# Patient Record
Sex: Female | Born: 1980 | Race: Black or African American | Hispanic: No | Marital: Married
Health system: Southern US, Community
[De-identification: ages and names within clinical notes are randomized; demographics above are authoritative.]

## PROBLEM LIST (undated history)

## (undated) DIAGNOSIS — E785 Hyperlipidemia, unspecified: Secondary | ICD-10-CM

## (undated) DIAGNOSIS — I10 Essential (primary) hypertension: Secondary | ICD-10-CM

## (undated) DIAGNOSIS — E119 Type 2 diabetes mellitus without complications: Secondary | ICD-10-CM

## (undated) HISTORY — DX: Hyperlipidemia, unspecified: E78.5

## (undated) HISTORY — DX: Type 2 diabetes mellitus without complications: E11.9

## (undated) HISTORY — DX: Essential (primary) hypertension: I10

---

## 1999-04-10 HISTORY — PX: BREAST EXCISIONAL BIOPSY: SUR124

## 2009-01-17 ENCOUNTER — Ambulatory Visit: Payer: Self-pay | Admitting: Obstetrics and Gynecology

## 2009-02-07 ENCOUNTER — Ambulatory Visit: Payer: Self-pay | Admitting: Obstetrics and Gynecology

## 2009-03-09 ENCOUNTER — Ambulatory Visit: Payer: Self-pay | Admitting: Obstetrics and Gynecology

## 2009-04-30 ENCOUNTER — Emergency Department: Payer: Self-pay | Admitting: Emergency Medicine

## 2009-06-02 ENCOUNTER — Inpatient Hospital Stay: Payer: Self-pay

## 2010-03-27 ENCOUNTER — Emergency Department: Payer: Self-pay | Admitting: Emergency Medicine

## 2010-10-19 ENCOUNTER — Encounter: Payer: Self-pay | Admitting: Obstetrics & Gynecology

## 2010-10-23 ENCOUNTER — Ambulatory Visit: Payer: Self-pay | Admitting: Obstetrics and Gynecology

## 2010-11-08 ENCOUNTER — Ambulatory Visit: Payer: Self-pay | Admitting: Obstetrics and Gynecology

## 2010-11-13 ENCOUNTER — Emergency Department: Payer: Self-pay | Admitting: Emergency Medicine

## 2010-12-07 ENCOUNTER — Encounter: Payer: Self-pay | Admitting: Obstetrics & Gynecology

## 2010-12-12 ENCOUNTER — Ambulatory Visit: Payer: Self-pay | Admitting: Obstetrics and Gynecology

## 2010-12-25 ENCOUNTER — Encounter: Payer: Self-pay | Admitting: Maternal and Fetal Medicine

## 2010-12-26 ENCOUNTER — Encounter: Payer: Self-pay | Admitting: Pediatric Cardiology

## 2011-02-28 ENCOUNTER — Observation Stay: Payer: Self-pay | Admitting: Obstetrics and Gynecology

## 2011-04-06 ENCOUNTER — Inpatient Hospital Stay: Payer: Self-pay

## 2012-03-06 IMAGING — US US OB >= 14 WKS - NRPT
1 series · 14 of 28 positions shown · non-contrast
Comparison: none

[Series 1: us ob >= 14 wks - nrpt · 14 of 38 slices shown]
[im 2/38]
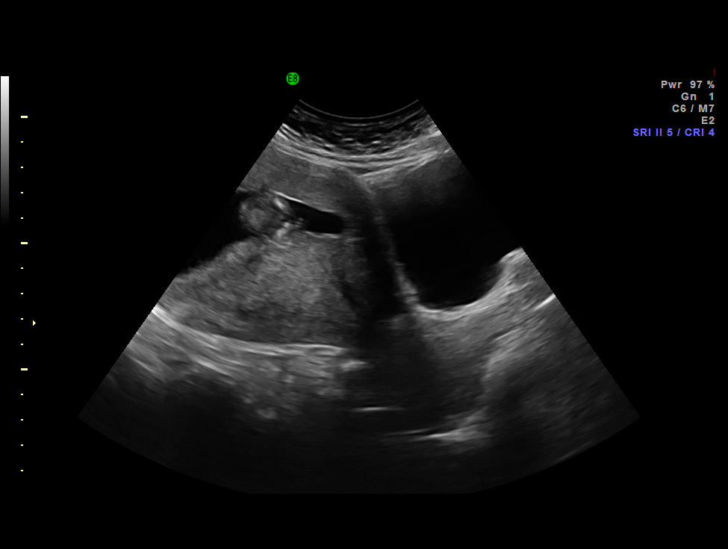
[im 5/38]
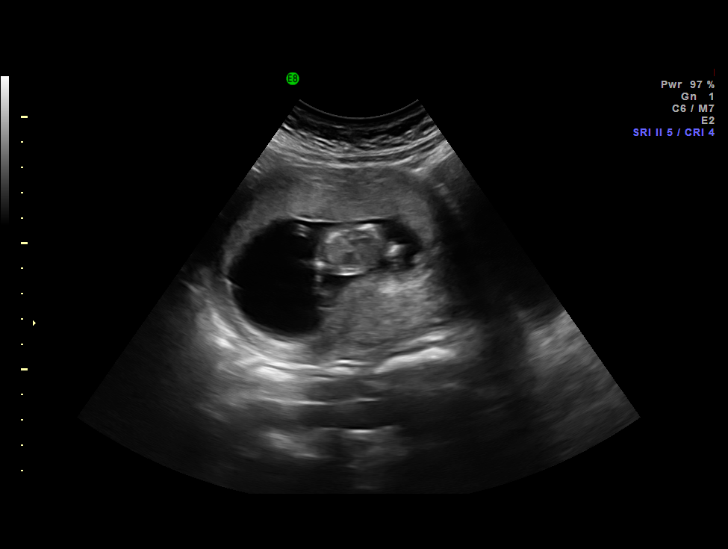
[im 7/38]
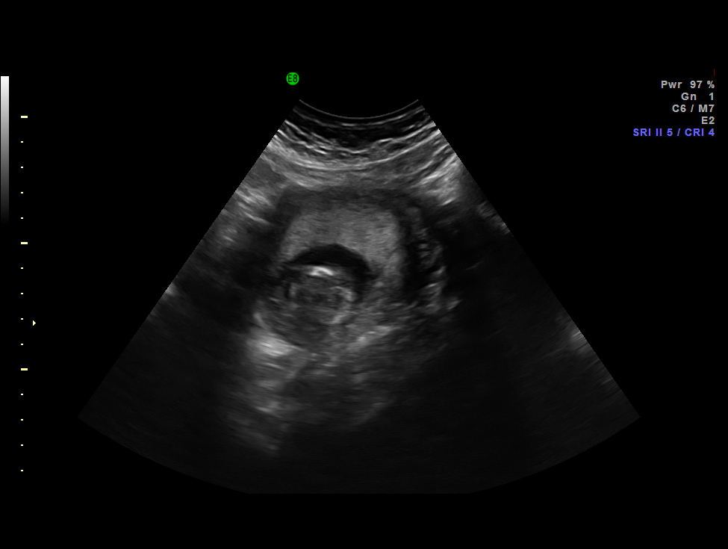
[im 10/38]
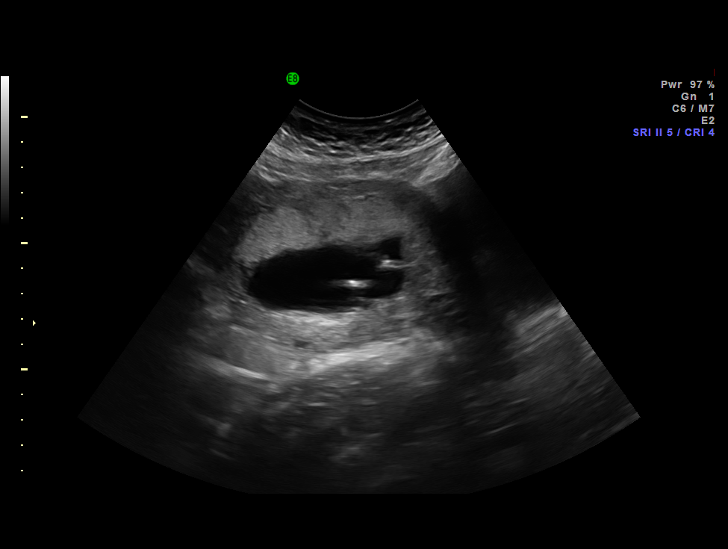
[im 13/38]
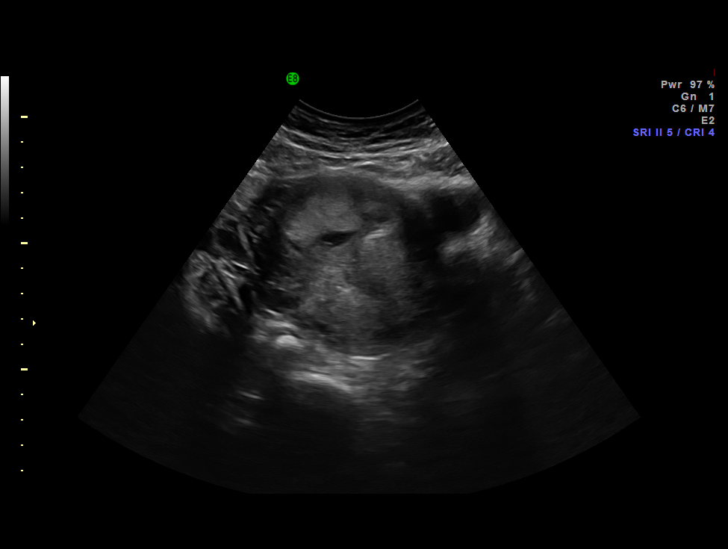
[im 16/38]
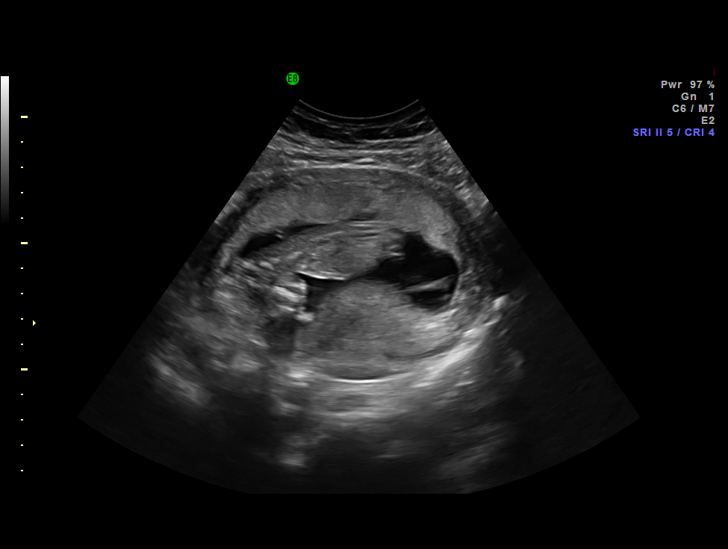
[im 18/38]
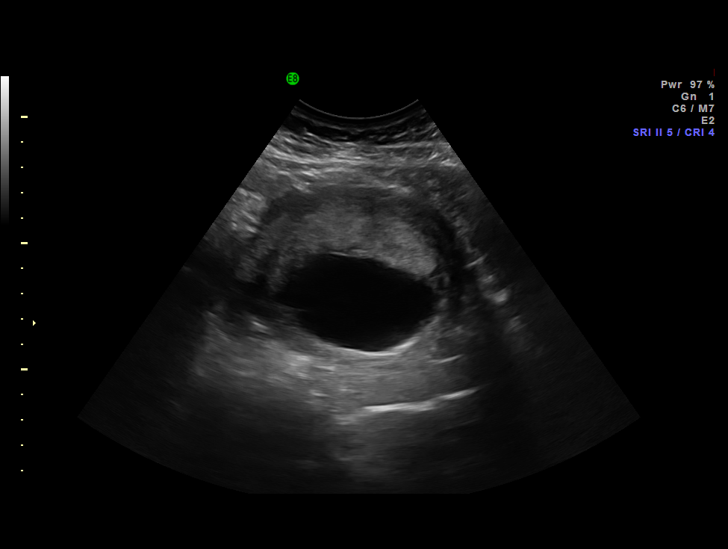
[im 21/38]
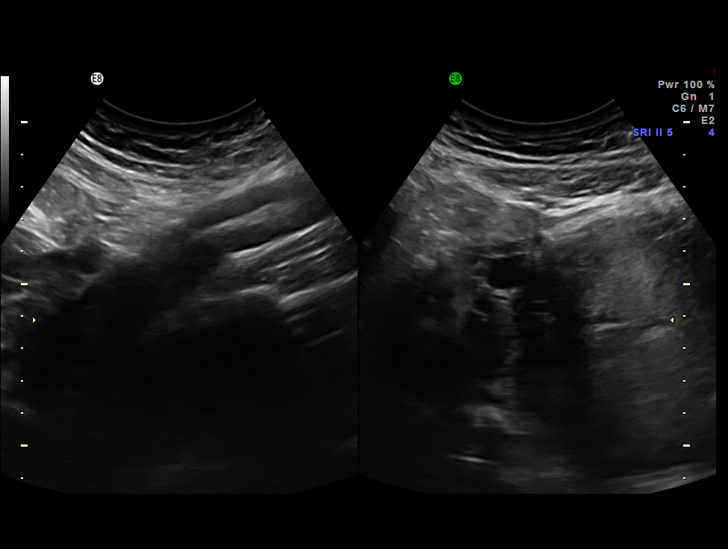
[im 24/38]
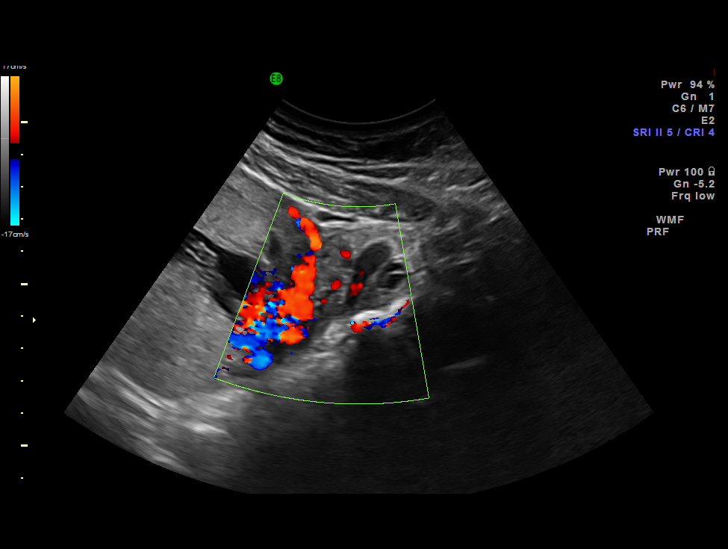
[im 27/38]
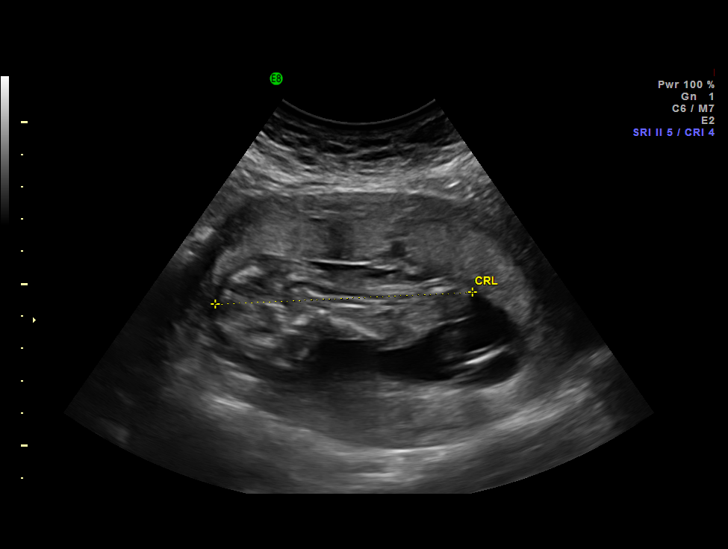
[im 29/38]
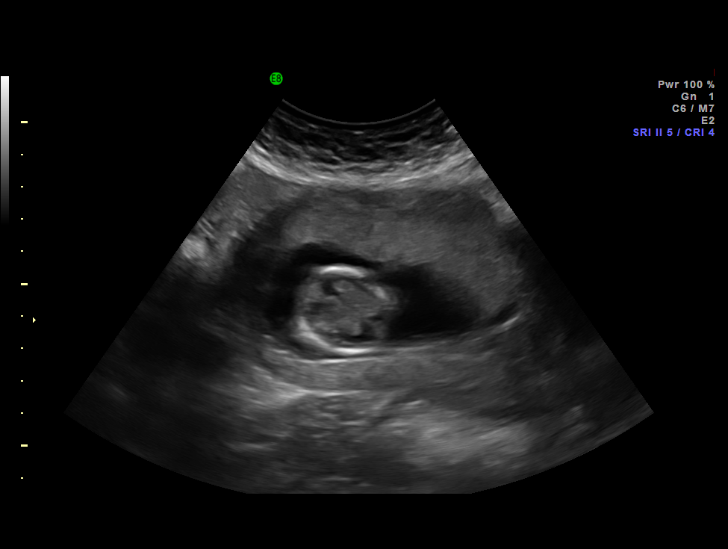
[im 32/38]
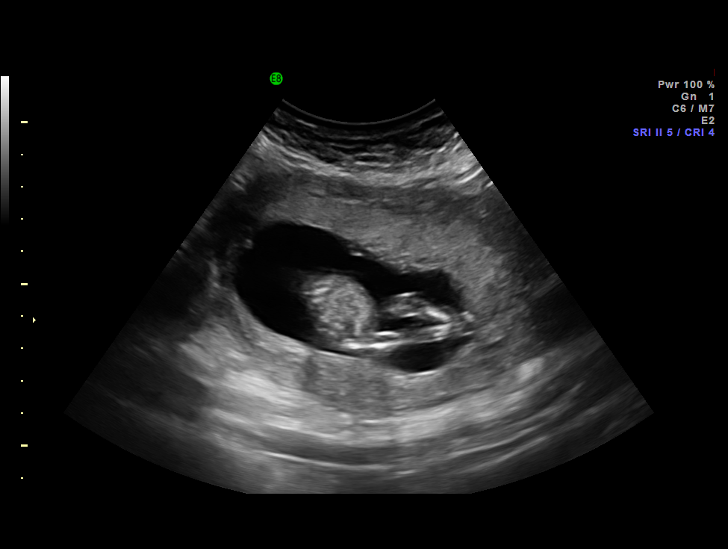
[im 35/38]
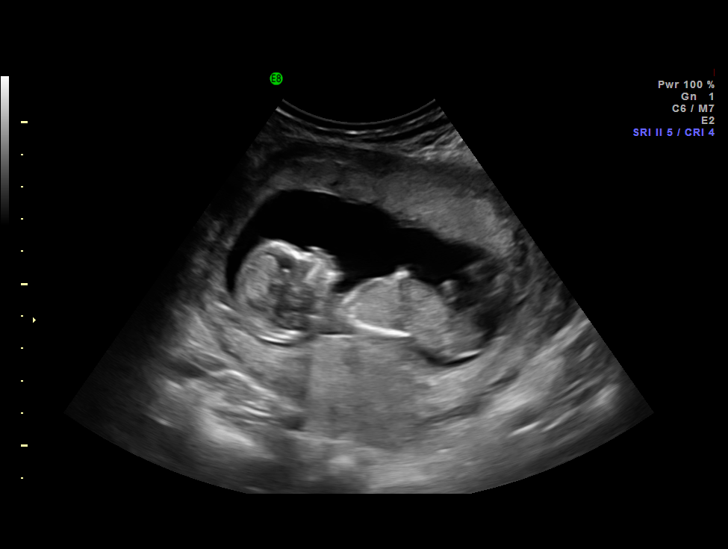
[im 38/38]
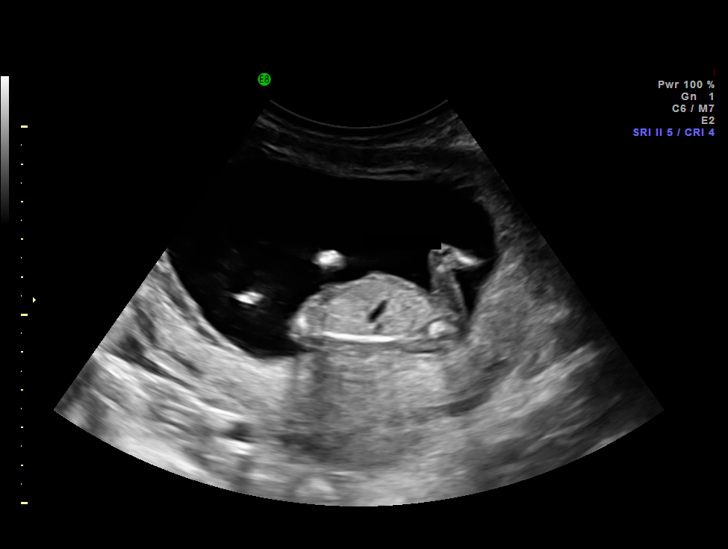

[14 of 28 positions shown; findings below may reference images not displayed]

IMAGES IMPORTED FROM THE SYNGO WORKFLOW SYSTEM
NO DICTATION FOR STUDY

## 2013-03-18 ENCOUNTER — Observation Stay: Payer: Self-pay | Admitting: Obstetrics and Gynecology

## 2013-03-18 LAB — URINALYSIS, COMPLETE
Bilirubin,UR: NEGATIVE
Blood: NEGATIVE
Ketone: NEGATIVE
Ph: 6 (ref 4.5–8.0)
Protein: NEGATIVE
RBC,UR: 1 /HPF (ref 0–5)
Specific Gravity: 1.009 (ref 1.003–1.030)
Squamous Epithelial: 16
WBC UR: 15 /HPF (ref 0–5)

## 2013-04-27 ENCOUNTER — Ambulatory Visit: Payer: Self-pay

## 2013-05-10 ENCOUNTER — Ambulatory Visit: Payer: Self-pay

## 2013-07-25 ENCOUNTER — Inpatient Hospital Stay: Payer: Self-pay | Admitting: Obstetrics and Gynecology

## 2013-07-25 LAB — CBC WITH DIFFERENTIAL/PLATELET
BASOS ABS: 0.1 10*3/uL (ref 0.0–0.1)
Basophil %: 0.6 %
EOS ABS: 0 10*3/uL (ref 0.0–0.7)
Eosinophil %: 0.2 %
HCT: 38.4 % (ref 35.0–47.0)
HGB: 12.3 g/dL (ref 12.0–16.0)
LYMPHS ABS: 1.8 10*3/uL (ref 1.0–3.6)
Lymphocyte %: 18.7 %
MCH: 27.5 pg (ref 26.0–34.0)
MCHC: 32 g/dL (ref 32.0–36.0)
MCV: 86 fL (ref 80–100)
Monocyte #: 0.7 x10 3/mm (ref 0.2–0.9)
Monocyte %: 6.9 %
NEUTROS ABS: 7.1 10*3/uL — AB (ref 1.4–6.5)
Neutrophil %: 73.6 %
PLATELETS: 219 10*3/uL (ref 150–440)
RBC: 4.47 10*6/uL (ref 3.80–5.20)
RDW: 15.5 % — ABNORMAL HIGH (ref 11.5–14.5)
WBC: 9.7 10*3/uL (ref 3.6–11.0)

## 2013-07-25 LAB — GC/CHLAMYDIA PROBE AMP

## 2013-07-25 LAB — GLUCOSE, RANDOM: GLUCOSE: 118 mg/dL — AB (ref 65–99)

## 2013-07-26 LAB — HEMATOCRIT: HCT: 34.9 % — ABNORMAL LOW (ref 35.0–47.0)

## 2013-07-26 LAB — HEMOGLOBIN: HGB: 11.3 g/dL — ABNORMAL LOW (ref 12.0–16.0)

## 2013-07-28 LAB — PATHOLOGY REPORT

## 2014-07-31 NOTE — Op Note (Signed)
PATIENT NAME:  Elizabeth Hopkins, [name] MR#:  [id number] DATE OF BIRTH:  July 03, 1980  DATE OF PROCEDURE:  07/26/2013  PREOPERATIVE DIAGNOSIS: Elective postpartum sterilization.   POSTOPERATIVE DIAGNOSIS: Elective permanent sterilization.   PROCEDURE: Bilateral tubal ligation, [place].   SURGEON:  Suzy Bouchardhomas J. Daimian Sudberry, MD  ANESTHESIA:  General endotracheal.   INDICATION: This is a 34 year old gravida 7, para 5, who delivered a healthy female yesterday. The patient has elected for permanent sterilization. She reconfirmed this desire the day of the procedure. All questions have been answered.   PROCEDURE: After adequate general endotracheal anesthesia, the patient was placed in the dorsal supine position. The patient was prepped and draped in normal sterile fashion. A 15 mm infraumbilical incision was made. Sharp dissection was used to open the fascia and the peritoneum. The patient was placed in Trendelenburg. The right fallopian tube was identified. The fimbria was identified as well and 2 separate 0 plain gut sutures were placed at the midportion of the fallopian tube and a 1.5 cm portion of fallopian tube was removed. Good hemostasis was noted. A similar procedure was repeated on the patient's left fallopian tube. After visualizing the fimbriated end, again 2 separate 0 plain gut sutures were placed and a 1.5 cm portion of the fallopian tube removed. Good hemostasis was noted. There were no complications The fascia was closed with 2-0 Vicryl suture and the skin was reapproximated with interrupted 4-0 Vicryl suture. Sterile dressing applied. The patient was taken to the recovery room in good condition.   ESTIMATED BLOOD LOSS: Minimal.   INTRAOPERATIVE FLUIDS: 600 mL.   ____________________________ Suzy Bouchardhomas J. Betzaida Cremeens, MD tjs:cs D: 07/26/2013 10:17:33 ET T: 07/26/2013 18:13:59 ET JOB#: [id number]  cc: Suzy Bouchardhomas J. Jaryn Rosko, MD, <Dictator> Suzy BouchardHOMAS J Nicholette Dolson MD ELECTRONICALLY SIGNED 07/30/2013  9:01

## 2015-09-19 ENCOUNTER — Ambulatory Visit
Admission: RE | Admit: 2015-09-19 | Discharge: 2015-09-19 | Disposition: A | Discharge status: Home / Self Care | Payer: Medicaid Other | Source: Ambulatory Visit | Attending: Internal Medicine | Admitting: Internal Medicine

## 2015-09-19 ENCOUNTER — Ambulatory Visit: Payer: Medicaid Other | Attending: Internal Medicine

## 2015-09-19 ENCOUNTER — Encounter (INDEPENDENT_AMBULATORY_CARE_PROVIDER_SITE_OTHER): Payer: Self-pay

## 2015-09-19 VITALS — BP 146/95 | HR 75 | Temp 98.5°F | Resp 16 | Ht 64.57 in | Wt 205.9 lb

## 2015-09-19 DIAGNOSIS — Z0000 Encounter for general adult medical examination without abnormal findings: Secondary | ICD-10-CM

## 2015-09-19 DIAGNOSIS — N644 Mastodynia: Secondary | ICD-10-CM

## 2015-09-19 NOTE — Progress Notes (Signed)
Subjective:     Patient ID: Elizabeth Hopkins, female   DOB: 04/05/1981, 35 y.o.   MRN: [id number]  HPI   Review of Systems     Objective:   Physical Exam  Pulmonary/Chest: Right breast exhibits tenderness. Right breast exhibits no inverted nipple, no mass, no nipple discharge and no skin change. Left breast exhibits no inverted nipple, no mass, no nipple discharge, no skin change and no tenderness. Breasts are symmetrical.         Assessment:     35 year old  patient presents for [facility] clinic visitPatient screened, and meets [facility] eligibility.  Patient does not have insurance, Medicare or Medicaid.  Handout given on Affordable Care Act. Instructed patient on breast self-exam using teach back method.  Patient reports she is having targeted pain at 12 o'clock right breast.  Palpated nodularity in the area of concern that is more prominent than left breast nodularity. Pelvic exam normal. Patient reports she had HPV positive pap in 2005, but all follow-up paps have been normal. She has 5 children 2,4,[phone] and 36 years old.    Plan:    sent for bilateral diagnostic mammogram, and ultrasound. Specimen collected for pap.

## 2015-09-22 LAB — PAP LB AND HPV HIGH-RISK
HPV, HIGH-RISK: NEGATIVE
PAP Smear Comment: 0

## 2015-10-19 ENCOUNTER — Other Ambulatory Visit: Payer: Self-pay

## 2015-10-19 DIAGNOSIS — R920 Mammographic microcalcification found on diagnostic imaging of breast: Secondary | ICD-10-CM

## 2015-10-19 DIAGNOSIS — N63 Unspecified lump in unspecified breast: Secondary | ICD-10-CM

## 2015-10-25 NOTE — Progress Notes (Addendum)
Letter mailed to patient to notify of six month follow-up mammogram and ultrasound scheduled for Thursday March 22, 2016 at 9:20 a.m.  Notified of normal pap with negative HPV.  Next pap due in 5 years.  Copy to [facility].

## 2016-03-22 ENCOUNTER — Other Ambulatory Visit: Payer: Medicaid Other

## 2016-03-22 ENCOUNTER — Ambulatory Visit: Payer: Medicaid Other

## 2016-10-15 ENCOUNTER — Encounter: Payer: Self-pay | Admitting: *Deleted

## 2016-10-15 ENCOUNTER — Ambulatory Visit
Admission: RE | Admit: 2016-10-15 | Discharge: 2016-10-15 | Disposition: A | Discharge status: Home / Self Care | Payer: Self-pay | Source: Ambulatory Visit | Attending: Oncology | Admitting: Oncology

## 2016-10-15 ENCOUNTER — Ambulatory Visit: Payer: Self-pay | Attending: Oncology | Admitting: *Deleted

## 2016-10-15 VITALS — BP 155/92 | HR 69 | Temp 98.6°F | Ht 64.0 in | Wt 201.0 lb

## 2016-10-15 DIAGNOSIS — R920 Mammographic microcalcification found on diagnostic imaging of breast: Secondary | ICD-10-CM

## 2016-10-15 NOTE — Progress Notes (Signed)
Subjective:     Patient ID: Jonell CluckKhydijah Friis, female   DOB: 01/23/1981, 36 y.o.   MRN: [id number]  HPI   Review of Systems     Objective:   Physical Exam  Pulmonary/Chest: Right breast exhibits no inverted nipple, no mass, no nipple discharge, no skin change and no tenderness. Left breast exhibits no inverted nipple, no mass, no nipple discharge, no skin change and no tenderness. Breasts are symmetrical.         Assessment:  36 [facility].  Last mammogram 09/19/15 was a birads 3, not calcification.  Patient was scheduled to return in December 2017, but did not return for her 6 month follow-up mammogram.  Patient has 5 children, 11, 9, 7, 5, and 3.  Family history of cancer includes her mom with liver cancer age 37 and her dad with prostate cancer age 37.  Both are deceased.  Clinical breast exam unremarkable.  Taught self breast awareness.  Blood pressure elevated at 155/92.  She is to recheck her blood pressure at [facility] or [facility], and if remains higher than 140/90 she is to follow-up with her primary care provider.  Hand out on hypertention given to patient.  Patient has been screened for eligibility.  She does not have any insurance, Medicare or Medicaid.  She also meets financial eligibility.  Hand-out given on the Affordable Care Act.    Plan:     Bilateral diagnostic mammogram and ultrasound ordered per protocol.  Will follow-up per [facility] protocol.

## 2016-10-15 NOTE — Patient Instructions (Signed)

## 2016-10-16 ENCOUNTER — Encounter: Payer: Self-pay | Admitting: *Deleted

## 2016-10-16 ENCOUNTER — Other Ambulatory Visit: Payer: Self-pay | Admitting: *Deleted

## 2016-10-16 DIAGNOSIS — R920 Mammographic microcalcification found on diagnostic imaging of breast: Secondary | ICD-10-CM

## 2016-10-16 NOTE — Progress Notes (Signed)
Letter mailed to inform patient of her birads 3 mammogram and need to return in 6 months.  Next appointment scheduled for 04/23/17 @ 10:00.  [facility] [place].

## 2017-04-17 ENCOUNTER — Ambulatory Visit: Payer: Self-pay | Attending: Oncology

## 2017-04-23 ENCOUNTER — Ambulatory Visit
Admission: RE | Admit: 2017-04-23 | Discharge: 2017-04-23 | Disposition: A | Discharge status: Home / Self Care | Payer: Medicaid Other | Source: Ambulatory Visit | Attending: Oncology | Admitting: Oncology

## 2017-04-23 DIAGNOSIS — R921 Mammographic calcification found on diagnostic imaging of breast: Secondary | ICD-10-CM | POA: Insufficient documentation

## 2017-04-23 DIAGNOSIS — R920 Mammographic microcalcification found on diagnostic imaging of breast: Secondary | ICD-10-CM

## 2017-05-01 NOTE — Progress Notes (Signed)
Letter mailed from [facility] to notify of normal mammogram results.  Patient to return in one year for annual screening.  Copy to [facility]. 

## 2017-09-05 IMAGING — US US BREAST*R* LIMITED INC AXILLA
1 series · 9 of 9 positions shown · non-contrast
Comparison: Previous exam(s).

CLINICAL DATA: Follow-up of probably benign bilateral
calcifications and right breast 12 o'clock 1 cm from the nipple
nodule. Patient did not return for the recommended six-month
follow-up following her diagnostic mammogram dated 09/19/2015.

EXAM:
2D DIGITAL DIAGNOSTIC BILATERAL MAMMOGRAM WITH CAD AND ADJUNCT TOMO
ULTRASOUND RIGHT BREAST

[Series 1: us breast*right* limited inc axilla · 0.03mm/px · 9 of 9 slices shown]
[im 1/9]
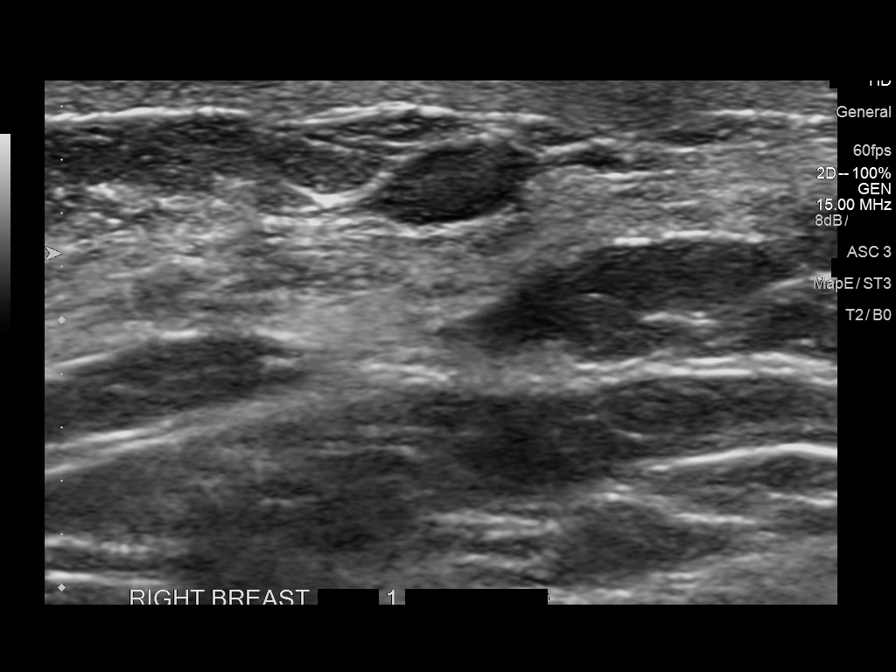
[im 2/9]
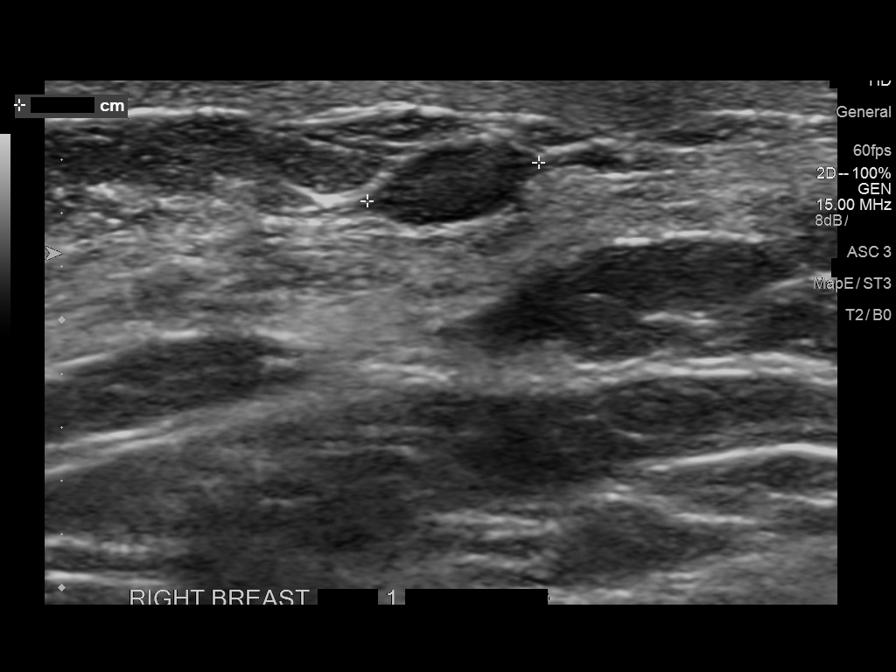
[im 3/9]
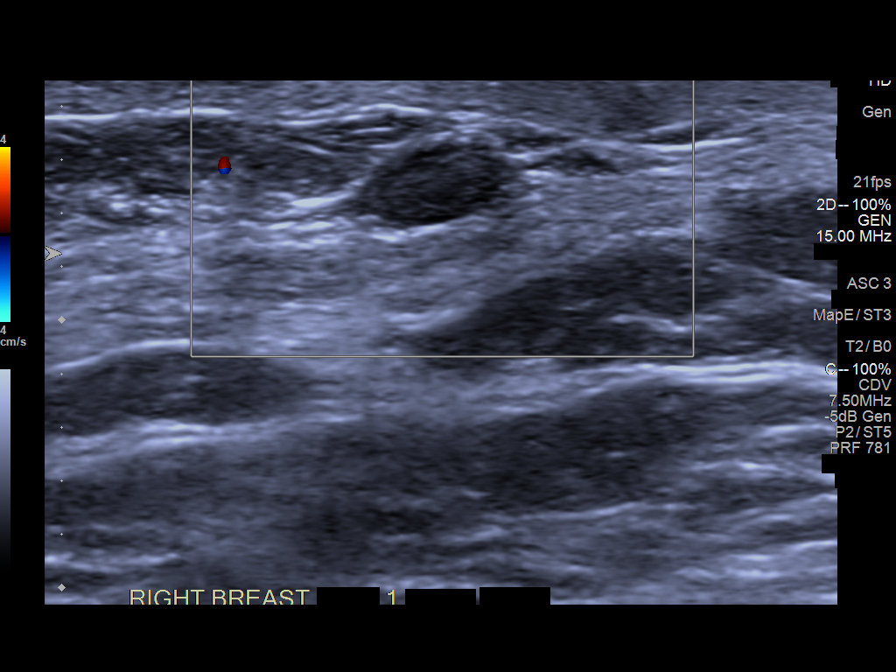
[im 4/9]
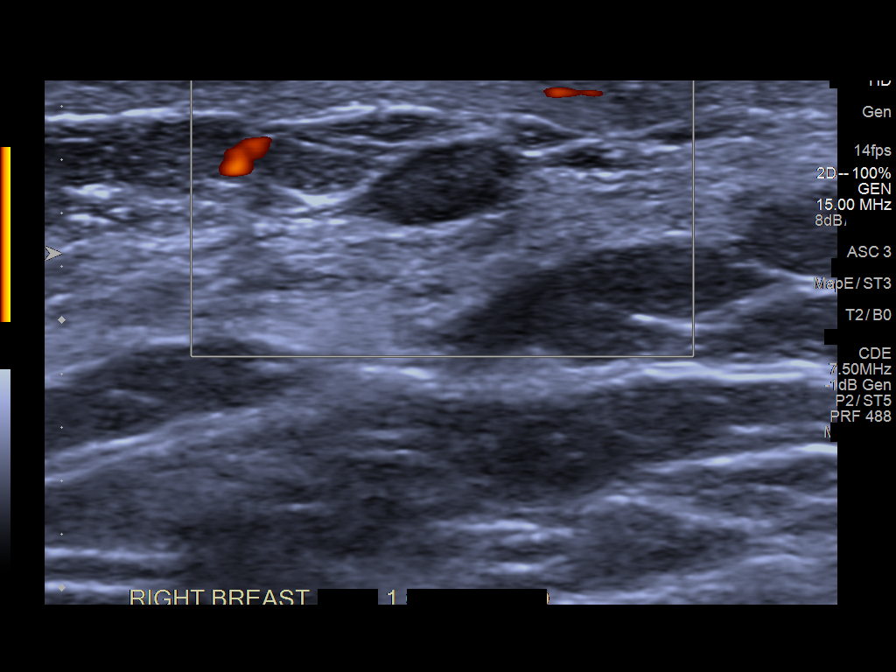
[im 5/9]
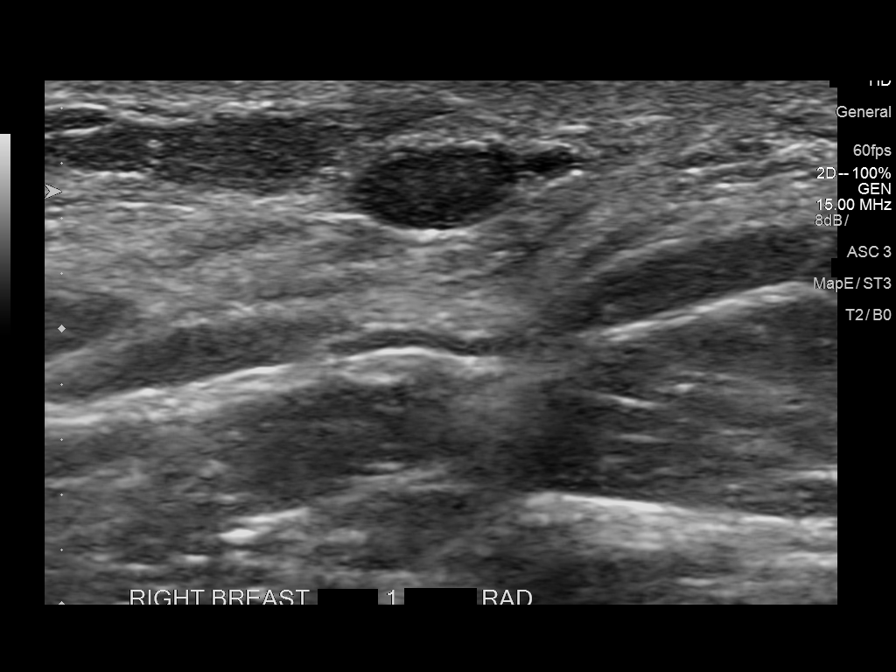
[im 6/9]
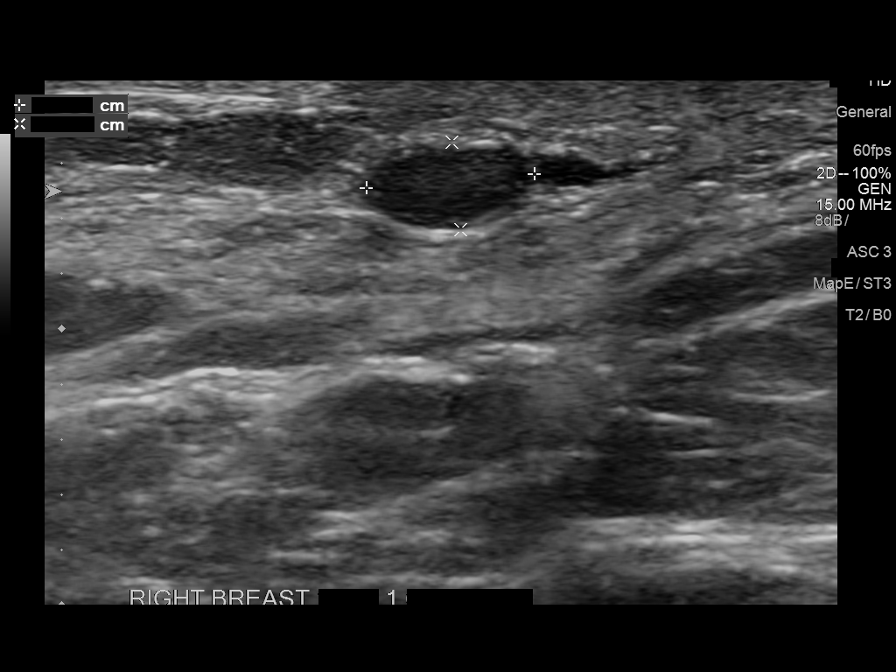
[im 7/9]
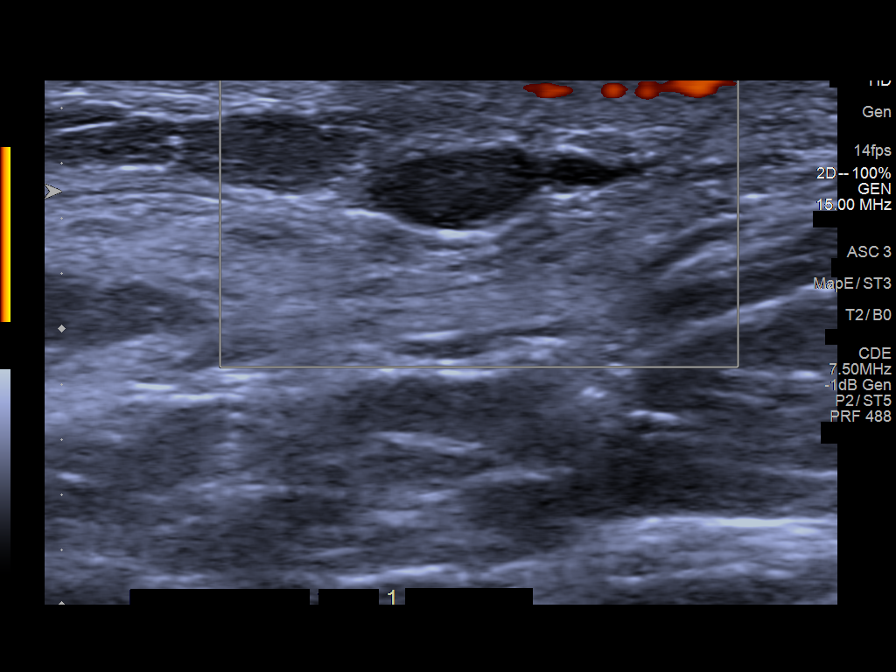
[im 8/9]
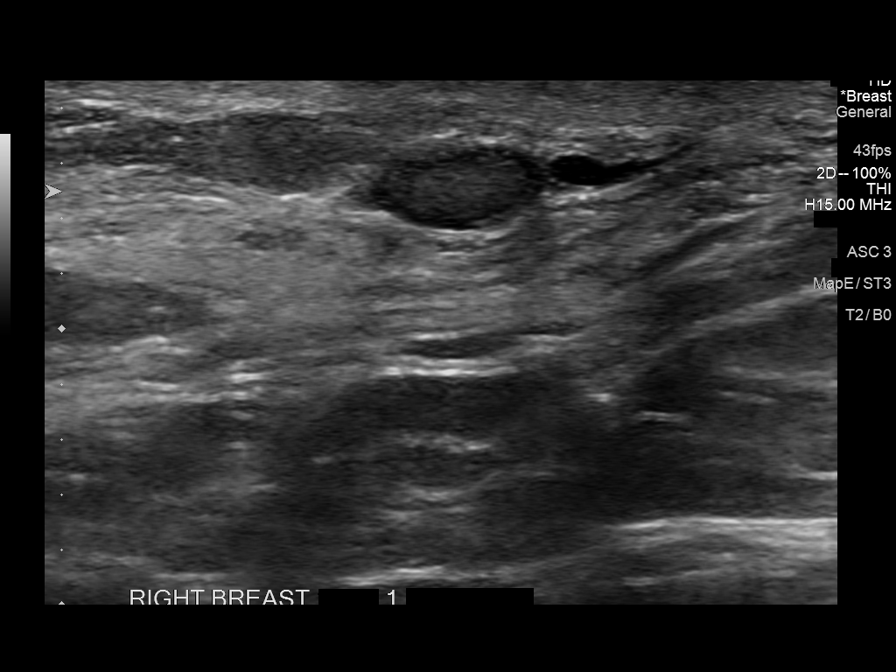
[im 9/9]
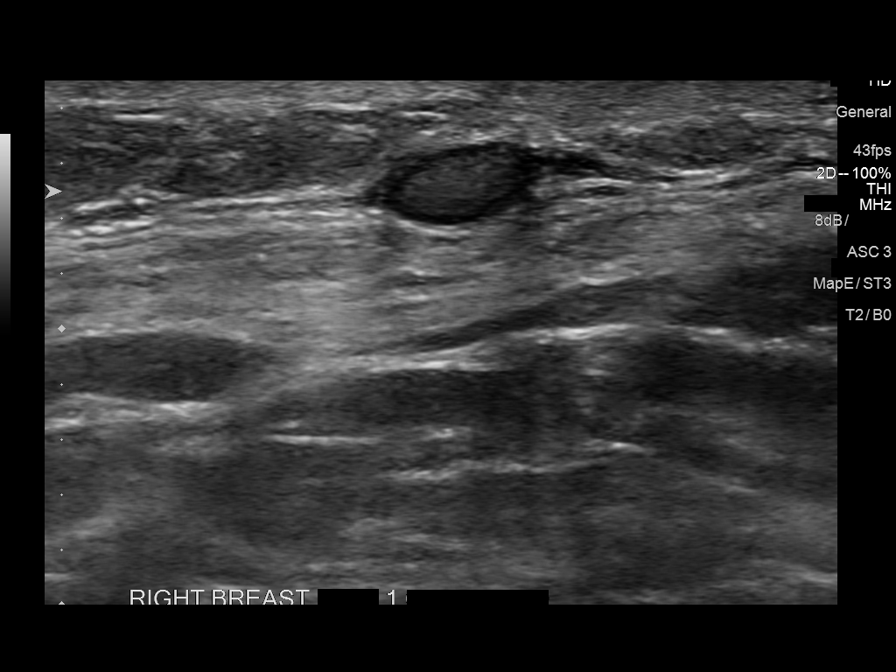

[9 of 9 positions shown; findings below may reference images not displayed]

ACR Breast Density Category b: There are scattered areas of
fibroglandular density.
FINDINGS: Mammographically, there are no suspicious masses, clustered
calcifications or areas of architectural distortion. Again seen are
diffusely scattered calcifications in bilateral breasts upper outer
quadrants.

Mammographic images were processed with CAD.

On physical exam, no suspicious masses are palpated. There is
postsurgical scarring in the right 12:30 o'clock subareolar breast,
from removal of fibroadenoma, per patient's report.

Targeted ultrasound is performed, showing stable in appearance focal
duct ectasia in the right breast 12 o'clock 1 cm from the nipple,
which measures 0.6 x 0.3 x 0.7 cm. Avascular hyperdense material
within it has the appearance of debris.
IMPRESSION: Stable bilateral probably benign scattered calcifications in the
upper-outer quadrants.

Stable right breast 12 o'clock duct ectasia containing debris.

RECOMMENDATION:
Bilateral diagnostic mammogram and right breast ultrasound in 6
months.

I have discussed the findings and recommendations with the patient.
Results were also provided in writing at the conclusion of the
visit. If applicable, a reminder letter will be sent to the patient
regarding the next appointment.

BI-RADS CATEGORY  3: Probably benign.

## 2019-11-23 ENCOUNTER — Ambulatory Visit: Payer: Medicaid Other | Attending: Internal Medicine

## 2019-11-23 DIAGNOSIS — Z23 Encounter for immunization: Secondary | ICD-10-CM

## 2019-11-23 NOTE — Progress Notes (Signed)
   Covid-19 Vaccination Clinic  Name:  Elizabeth Hopkins    MRN: [id number] DOB: Oct 21, 1980  11/23/2019  [name] was observed post Covid-19 immunization for 15 minutes without incident. She was provided with Vaccine Information Sheet and instruction to access the V-Safe system.   [name] was instructed to call 911 with any severe reactions post vaccine: Marland Kitchen Difficulty breathing  . Swelling of face and throat  . A fast heartbeat  . A bad rash all over body  . Dizziness and weakness   Immunizations Administered    Name Date Dose VIS Date Route   Moderna COVID-19 Vaccine 11/23/2019  3:14 PM 0.5 mL 03/2019 Intramuscular   Manufacturer: Moderna   Lot: [id number]   NDC: 91505-697-94

## 2019-12-21 ENCOUNTER — Ambulatory Visit: Payer: Medicaid Other

## 2020-01-04 ENCOUNTER — Ambulatory Visit: Payer: Medicaid Other | Attending: Internal Medicine

## 2020-01-04 DIAGNOSIS — Z23 Encounter for immunization: Secondary | ICD-10-CM

## 2020-01-04 NOTE — Progress Notes (Signed)
° °  Covid-19 Vaccination Clinic  Name:  Elizabeth Hopkins    MRN: [id number] DOB: Apr 05, 1981  01/04/2020  [name] was observed post Covid-19 immunization for 15 minutes without incident. She was provided with Vaccine Information Sheet and instruction to access the V-Safe system.   [name] was instructed to call 911 with any severe reactions post vaccine:  Difficulty breathing   Swelling of face and throat   A fast heartbeat   A bad rash all over body   Dizziness and weakness   Immunizations Administered    Name Date Dose VIS Date Route   Moderna COVID-19 Vaccine 01/04/2020  9:54 AM 0.5 mL 03/2019 Intramuscular   Manufacturer: Moderna   Lot: [id number]   NDC: 30076-226-33

## 2020-08-02 ENCOUNTER — Ambulatory Visit: Payer: Medicaid Other

## 2023-02-27 ENCOUNTER — Encounter: Payer: Medicaid Other | Admitting: Obstetrics

## 2023-03-05 ENCOUNTER — Encounter: Payer: No Typology Code available for payment source | Admitting: Obstetrics

## 2023-03-06 ENCOUNTER — Ambulatory Visit (INDEPENDENT_AMBULATORY_CARE_PROVIDER_SITE_OTHER): Payer: No Typology Code available for payment source | Admitting: Obstetrics

## 2023-03-06 ENCOUNTER — Encounter: Payer: Self-pay | Admitting: Obstetrics

## 2023-03-06 ENCOUNTER — Other Ambulatory Visit (HOSPITAL_COMMUNITY)
Admission: RE | Admit: 2023-03-06 | Discharge: 2023-03-06 | Disposition: A | Discharge status: Home / Self Care | Payer: No Typology Code available for payment source | Source: Ambulatory Visit | Attending: Obstetrics | Admitting: Obstetrics

## 2023-03-06 VITALS — BP 140/80 | HR 81 | Ht 62.0 in | Wt 190.0 lb

## 2023-03-06 DIAGNOSIS — Z124 Encounter for screening for malignant neoplasm of cervix: Secondary | ICD-10-CM | POA: Insufficient documentation

## 2023-03-06 DIAGNOSIS — Z01419 Encounter for gynecological examination (general) (routine) without abnormal findings: Secondary | ICD-10-CM

## 2023-03-06 DIAGNOSIS — E119 Type 2 diabetes mellitus without complications: Secondary | ICD-10-CM | POA: Insufficient documentation

## 2023-03-06 DIAGNOSIS — E7800 Pure hypercholesterolemia, unspecified: Secondary | ICD-10-CM | POA: Insufficient documentation

## 2023-03-06 DIAGNOSIS — Z1159 Encounter for screening for other viral diseases: Secondary | ICD-10-CM

## 2023-03-06 DIAGNOSIS — I10 Essential (primary) hypertension: Secondary | ICD-10-CM | POA: Insufficient documentation

## 2023-03-06 DIAGNOSIS — Z1231 Encounter for screening mammogram for malignant neoplasm of breast: Secondary | ICD-10-CM

## 2023-03-06 NOTE — Progress Notes (Signed)
GYNECOLOGY: ANNUAL EXAM   Subjective:    PCP: Judie Bonus, MD Elizabeth Hopkins is a 42 y.o. female [phone] who presents for annual wellness visit.   Well Woman Visit:  GYN HISTORY:  Patient's last menstrual period was 02/06/2023.     Menstrual History: OB History     Gravida  8   Para  5   Term  5   Preterm      AB  3   Living  5      SAB  3   IAB      Ectopic      Multiple      Live Births              Menarche age: 61 Patient's last menstrual period was 02/06/2023. Period Cycle (Days): 25 Period Duration (Days): 5 Period Pattern: Regular Menstrual Flow: Heavy, Moderate, Light Menstrual Control: Other (Comment) Menstrual Control Change Freq (Hours): 4 Dysmenorrhea: (!) Mild Dysmenorrhea Symptoms: Cramping   Periods are every 25 days, and last 5 days, flow is light / moderate / heavy.  Uses depends and changes it every 4 hours.  Cramping is mild.  Cyclic symptoms include: weight gain.  Intermenstrual bleeding, spotting, or discharge? no Urinary incontinence? no  Sexually active: yes  Number of sexual partners: 1 Gender of sexual Partners: male  Social History   Substance and Sexual Activity  Sexual Activity Yes   Partners: Male   Birth control/protection: Surgical   Contraceptive methods: no method tubal Dyspareunia? no STI history: No  STI/HIV testing or immunizations needed? No.   Health Maintenance: -Last pap: was normal  10/19/2015 --> Any abnormals: 1 and had a colpo prior to 2017 -Last mammogram: yes  --> Any abnormals? No  -FMH of Breast / Colon / Cervical cancer: colon maybe -Vaccines:  Immunization History  Administered Date(s) Administered   Moderna Sars-Covid-2 Vaccination 11/23/2019, 01/04/2020   Last Tdap: UTD / Flu: UTD / COVID: UTD / Gardasil: Declined / Shingles (50+): N/A / PCV20: N/A  -Hep C screen: today  -Last lipid / glucose screening: PCP  > Exercise: walking, very active > Dietary Supplements:  Folate: No;  Calcium: No}; Vitamin D: No > Body mass index is 34.75 kg/m.  > Recent dental visit No. > Seat Belt Use: Yes.   > Texting and driving? No. > Guns in the house Yes.   > Recreational or other drug use: denied.   Social History   Tobacco Use   Smoking status: Never   Smokeless tobacco: Never  Substance Use Topics   Alcohol use: Never   Occupation: Production designer, theatre/television/film     Lives with: husband     PHQ-2 Score: In last two weeks, how often have you felt: Little interest or pleasure in doing things: Not at all (0) Feeling down, depressed or hopeless: Not at all (0) Score: 0  GAD-2 Over the last 2 weeks, how often have you been bothered by the following problems? Feeling nervous, anxious or on edge: Several days (+1) Not being able to stop or control worrying: Several days (+1)} Score: 0 _________________________________________________________  Current Outpatient Medications  Medication Sig Dispense Refill   Dulaglutide 3 MG/0.5ML SOAJ Inject into the skin.     ferrous sulfate 325 (65 FE) MG tablet Take by mouth.     lisinopril (ZESTRIL) 20 MG tablet Take 20 mg by mouth daily.     metFORMIN (GLUCOPHAGE-XR) 500 MG 24 hr tablet SMARTSIG:2 Tablet(s) By Mouth Every Evening  rosuvastatin (CRESTOR) 10 MG tablet Take 1 tablet by mouth at bedtime.     No current facility-administered medications for this visit.   No Known Allergies  Past Medical History:  Diagnosis Date   Diabetes (HCC)    Hyperlipemia    Hypertension    Past Surgical History:  Procedure Laterality Date   BREAST EXCISIONAL BIOPSY Right 2001   fibroadenoma removal    Review Of Systems  Constitutional: Denied constitutional symptoms, night sweats, recent illness, fatigue, fever, insomnia and weight loss.  Eyes: Denied eye symptoms, eye pain, photophobia, vision change and visual disturbance.  Ears/Nose/Throat/Neck: Denied ear, nose, throat or neck symptoms, hearing loss, nasal discharge, sinus congestion  and sore throat.  Cardiovascular: Denied cardiovascular symptoms, arrhythmia, chest pain/pressure, edema, exercise intolerance, orthopnea and palpitations.  Respiratory: Denied pulmonary symptoms, asthma, pleuritic pain, productive sputum, cough, dyspnea and wheezing.  Gastrointestinal: Denied, gastro-esophageal reflux, melena, nausea and vomiting.  Genitourinary: Denied genitourinary symptoms including symptomatic vaginal discharge, pelvic relaxation issues, and urinary complaints.  Musculoskeletal: Denied musculoskeletal symptoms, stiffness, swelling, muscle weakness and myalgia.  Dermatologic: Denied dermatology symptoms, rash and scar.  Neurologic: Denied neurology symptoms, dizziness, headache, neck pain and syncope.  Psychiatric: Denied psychiatric symptoms, anxiety and depression.  Endocrine: Denied endocrine symptoms including hot flashes and night sweats.      Objective:    BP (!) 140/80   Pulse 81   Ht 5\' 2"  (1.575 m)   Wt 190 lb (86.2 kg)   LMP 02/06/2023   BMI 34.75 kg/m   Constitutional: Well-developed, well-nourished female in no acute distress Neurological: Alert and oriented to person, place, and time Psychiatric: Mood and affect appropriate Skin: No rashes or lesions Neck: Supple without masses. Trachea is midline.Thyroid is normal size without masses Lymphatics: No cervical, axillary, supraclavicular, or inguinal adenopathy noted Respiratory: Clear to auscultation bilaterally. Good air movement with normal work of breathing. Cardiovascular: Regular rate and rhythm. Extremities grossly normal, nontender with no edema; pulses regular Gastrointestinal: Soft, nontender, nondistended. No masses or hernias appreciated. No hepatosplenomegaly. No fluid wave. No rebound or guarding. Breast Exam: normal appearance, no masses or tenderness, Inspection negative, No nipple retraction or dimpling, No nipple discharge or bleeding, No axillary or supraclavicular adenopathy, Normal  to palpation without dominant masses Genitourinary:         External Genitalia: Normal female genitalia    Vagina: Normal mucosa, no lesions.    Cervix: No lesions, normal size and consistency; no cervical motion tenderness; non-friable; Pap obtained.    Uterus: Normal size and contour; smooth, mobile, NT Adnexae: Non-palpable and non-tender Perineum/Anus: No lesions Rectal: deferred    Assessment/Plan:    Elizabeth Hopkins is a 42 y.o. female [phone] with normal well-woman gynecologic exam.  -Screenings:  Pap: done with cotesting today Mammogram: ordered Labs: per PCP GAD/PHQ-2 = 0 -Contraception: s/p BTL -Vaccines: states UTD; Gardasil discussion today, pt will consider -Healthy lifestyle modifications discussed: multivitamin, diet, exercise, sunscreen, tobacco and alcohol use. Emphasized importance of regular physical activity.  -Calcium and Vit D recommendation reviewed.  -All questions answered to patient's satisfaction.   Return in about 1 year (around 03/05/2024) for Annual.    Julieanne Manson, DO [facility]

## 2023-03-07 LAB — HEPATITIS C ANTIBODY: Hep C Virus Ab: NONREACTIVE

## 2023-03-11 LAB — CYTOLOGY - PAP
Comment: NEGATIVE
Diagnosis: NEGATIVE
High risk HPV: NEGATIVE

## 2024-04-20 ENCOUNTER — Ambulatory Visit: Payer: Self-pay | Admitting: General Practice
